# Patient Record
Sex: Male | Born: 1965 | Race: White | Hispanic: No | Marital: Single
Health system: Southern US, Community
[De-identification: ages and names within clinical notes are randomized; demographics above are authoritative.]

---

## 2004-05-22 ENCOUNTER — Encounter: Admission: RE | Admit: 2004-05-22 | Discharge: 2004-05-22 | Payer: Self-pay | Admitting: Neurological Surgery

## 2004-05-22 IMAGING — RF DG EPIDUROGRAM S+I
1 series · 1 of 1 positions shown · non-contrast
Comparison: none

CLINICAL DATA: Neck pain.
 RIGHT C7-T1 NONSELECTIVE EPIDURAL
 Following informed consent, sterile preparation of the neck, and adequate local anesthesia, a 20 gauge Crawford needle was placed in the epidural space at C7-T1 on the right/left.  Contrast injection showed right sided spread above and below.
 I injected 60 mg of Kenalog along with 1 cc of 1% Lidocaine.  Post procedure, the patient was comfortable.
 IMPRESSION
 Technically successful right C7-T1 nonselective epidural #1.

[Series 1: (hospital) · 1 of 1 slices shown]
[im 1/1]
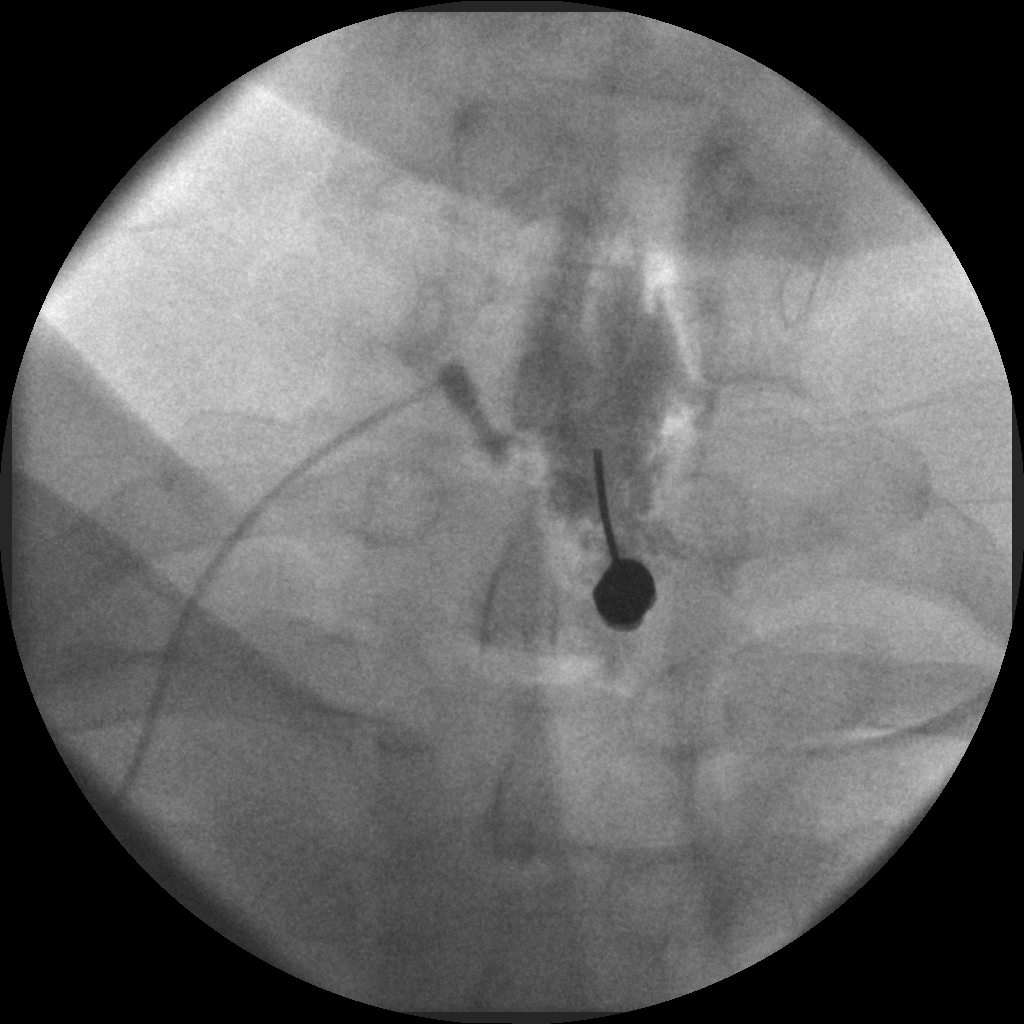

[1 of 1 positions shown; findings below may reference images not displayed]

## 2004-06-09 ENCOUNTER — Encounter: Admission: RE | Admit: 2004-06-09 | Discharge: 2004-06-09 | Payer: Self-pay | Admitting: Neurological Surgery

## 2004-07-27 ENCOUNTER — Encounter: Admission: RE | Admit: 2004-07-27 | Discharge: 2004-07-27 | Payer: Self-pay | Admitting: Neurological Surgery

## 2020-07-23 ENCOUNTER — Telehealth: Payer: Self-pay | Admitting: Unknown Physician Specialty

## 2020-07-23 ENCOUNTER — Other Ambulatory Visit: Payer: Self-pay | Admitting: Unknown Physician Specialty

## 2020-07-23 DIAGNOSIS — U071 COVID-19: Secondary | ICD-10-CM

## 2020-07-23 DIAGNOSIS — E119 Type 2 diabetes mellitus without complications: Secondary | ICD-10-CM

## 2020-07-23 NOTE — Telephone Encounter (Signed)
I connected by phone with Pedro Miles on 07/23/2020 at 9:36 AM to discuss the potential use of a new treatment for mild to moderate COVID-19 viral infection in non-hospitalized patients.  This patient is a 54 y.o. male that meets the FDA criteria for Emergency Use Authorization of COVID monoclonal antibody casirivimab/imdevimab.  Has a (+) direct SARS-CoV-2 viral test result  Has mild or moderate COVID-19   Is NOT hospitalized due to COVID-19  Is within 10 days of symptom onset  Has at least one of the high risk factor(s) for progression to severe COVID-19 and/or hospitalization as defined in EUA.  Specific high risk criteria : Diabetes   I have spoken and communicated the following to the patient or parent/caregiver regarding COVID monoclonal antibody treatment:  1. FDA has authorized the emergency use for the treatment of mild to moderate COVID-19 in adults and pediatric patients with positive results of direct SARS-CoV-2 viral testing who are 60 years of age and older weighing at least 40 kg, and who are at high risk for progressing to severe COVID-19 and/or hospitalization.  2. The significant known and potential risks and benefits of COVID monoclonal antibody, and the extent to which such potential risks and benefits are unknown.  3. Information on available alternative treatments and the risks and benefits of those alternatives, including clinical trials.  4. Patients treated with COVID monoclonal antibody should continue to self-isolate and use infection control measures (e.g., wear mask, isolate, social distance, avoid sharing personal items, clean and disinfect high touch surfaces, and frequent handwashing) according to CDC guidelines.   5. The patient or parent/caregiver has the option to accept or refuse COVID monoclonal antibody treatment.  After reviewing this information with the patient, The patient agreed to proceed with receiving casirivimab\imdevimab infusion and will  be provided a copy of the Fact sheet prior to receiving the infusion. Gabriel Cirri 07/23/2020 9:36 AM  Sx onset 9/7

## 2020-07-24 ENCOUNTER — Ambulatory Visit (HOSPITAL_COMMUNITY)
Admission: RE | Admit: 2020-07-24 | Discharge: 2020-07-24 | Disposition: A | Discharge status: Home / Self Care | Payer: Managed Care, Other (non HMO) | Source: Ambulatory Visit | Attending: Pulmonary Disease | Admitting: Pulmonary Disease

## 2020-07-24 DIAGNOSIS — U071 COVID-19: Secondary | ICD-10-CM | POA: Insufficient documentation

## 2020-07-24 DIAGNOSIS — E119 Type 2 diabetes mellitus without complications: Secondary | ICD-10-CM | POA: Diagnosis not present

## 2020-07-24 MED ORDER — FAMOTIDINE IN NACL 20-0.9 MG/50ML-% IV SOLN: 20.0000 mg | Freq: Once | INTRAVENOUS | Status: DC | PRN

## 2020-07-24 MED ORDER — DIPHENHYDRAMINE HCL 50 MG/ML IJ SOLN: 50.0000 mg | Freq: Once | INTRAMUSCULAR | Status: DC | PRN

## 2020-07-24 MED ORDER — SODIUM CHLORIDE 0.9 % IV SOLN
1200.0000 mg | Freq: Once | INTRAVENOUS | Status: AC
  Administered 2020-07-24: 1200 mg via INTRAVENOUS
  Filled 2020-07-24: qty 10

## 2020-07-24 MED ORDER — SODIUM CHLORIDE 0.9 % IV SOLN: INTRAVENOUS | Status: DC | PRN

## 2020-07-24 MED ORDER — ALBUTEROL SULFATE HFA 108 (90 BASE) MCG/ACT IN AERS: 2.0000 | INHALATION_SPRAY | Freq: Once | RESPIRATORY_TRACT | Status: DC | PRN

## 2020-07-24 MED ORDER — EPINEPHRINE 0.3 MG/0.3ML IJ SOAJ: 0.3000 mg | Freq: Once | INTRAMUSCULAR | Status: DC | PRN

## 2020-07-24 MED ORDER — METHYLPREDNISOLONE SODIUM SUCC 125 MG IJ SOLR: 125.0000 mg | Freq: Once | INTRAMUSCULAR | Status: DC | PRN

## 2020-07-24 NOTE — Progress Notes (Signed)
  Diagnosis: COVID-19  Physician: Dr. Wright   Procedure: [facility] Med: casirivimab\imdevimab infusion - Provided patient with casirivimab\imdevimab fact sheet for patients, parents and caregivers prior to infusion.  Complications: No immediate complications noted.  Discharge: Discharged home   Shelly Shoultz  Bell 07/24/2020   

## 2020-07-24 NOTE — Discharge Instructions (Signed)
